# Patient Record
Sex: Male | Born: 1995 | Race: White | Hispanic: No | Marital: Single | Smoking: Never smoker
Health system: Southern US, Community
[De-identification: ages and names within clinical notes are randomized; demographics above are authoritative.]

---
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
## 2015-01-21 IMAGING — CR DG HAND COMPLETE 3+V*L*
1 series · 3 of 3 positions shown · non-contrast
Comparison: None.

CLINICAL DATA: Pain post trauma

EXAM:
LEFT HAND - COMPLETE 3+ VIEW

[Series 1: pa · 0.17mm/px · 3 of 3 slices shown]
[im 1/3]
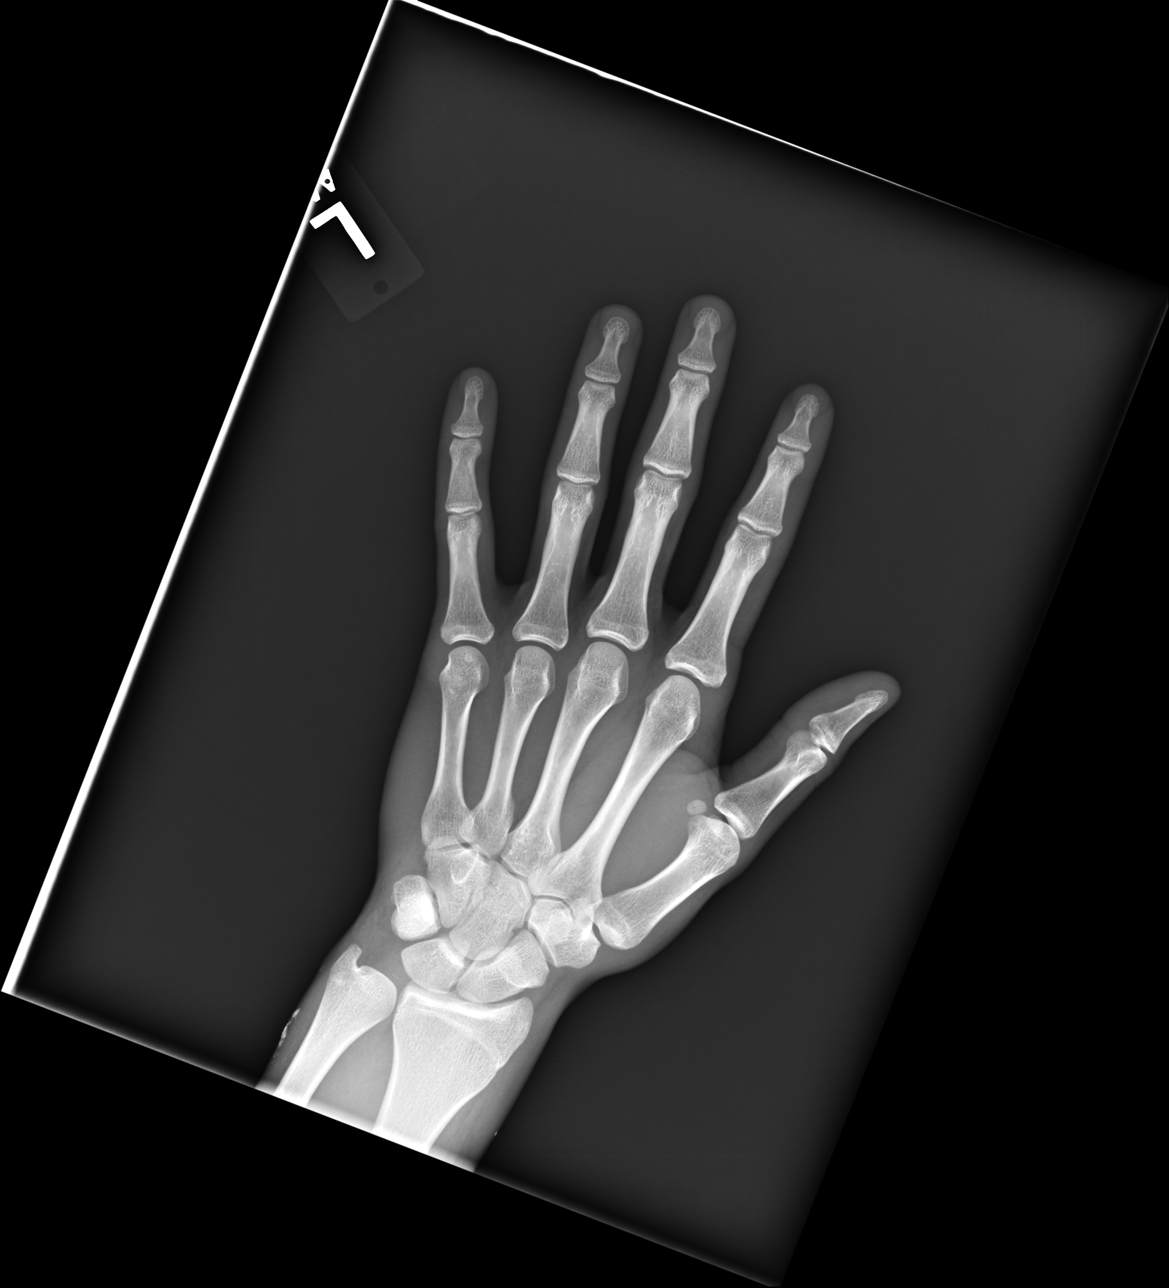
[im 2/3]
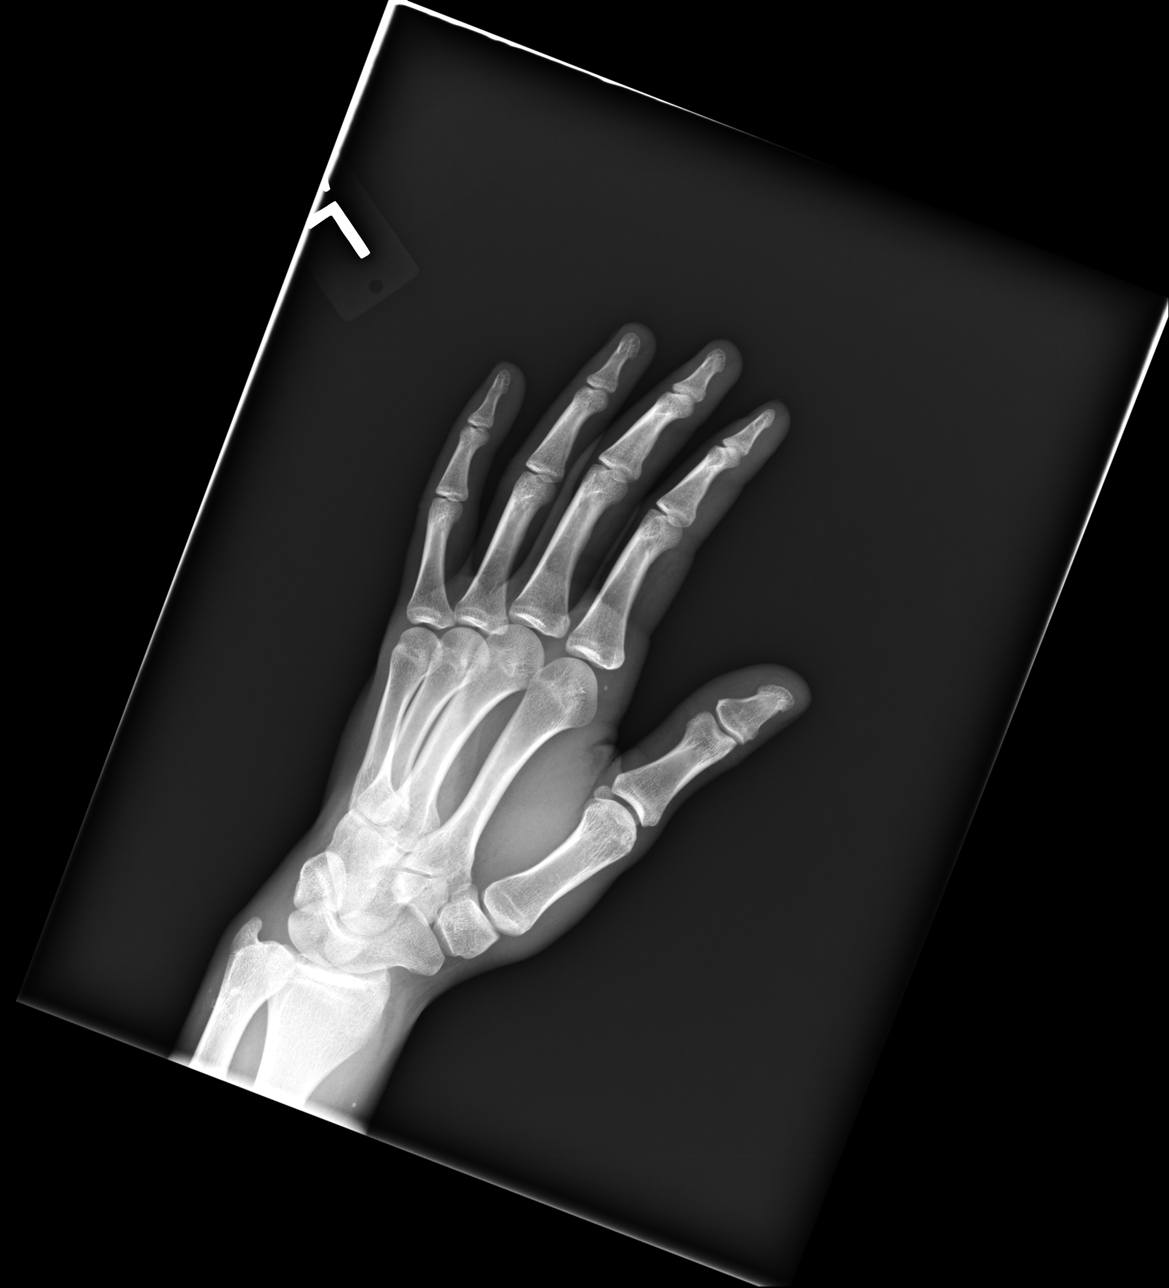
[im 3/3]
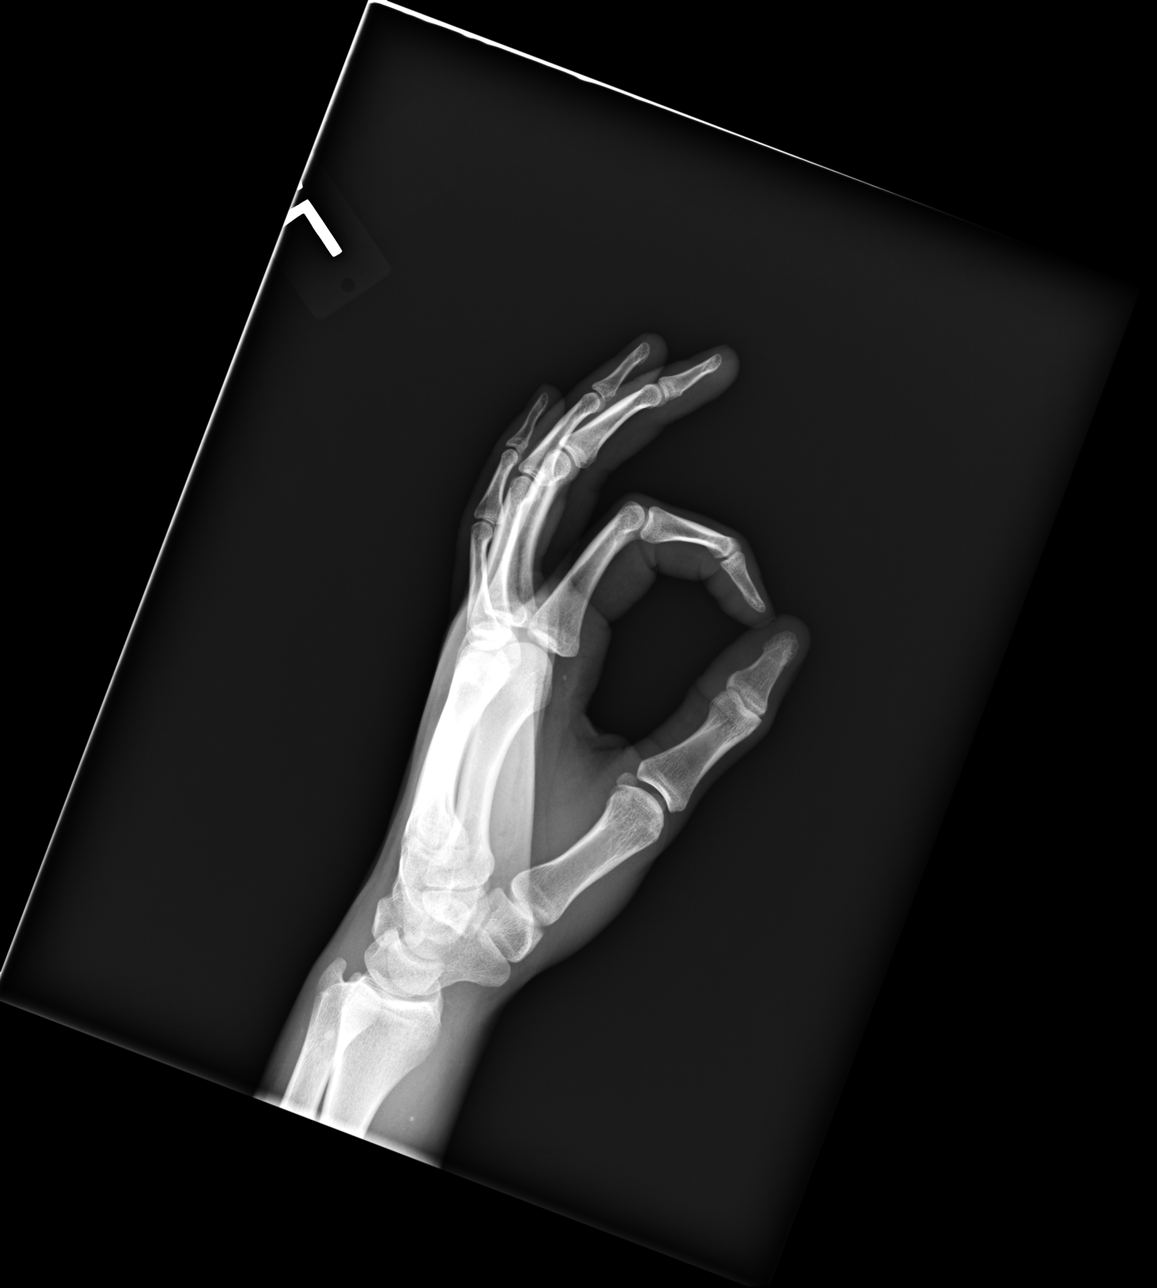

[3 of 3 positions shown; findings below may reference images not displayed]

FINDINGS: Frontal, oblique, and lateral views were obtained. There is no
fracture or dislocation. Joint spaces appear intact. No erosive
change.
IMPRESSION: No abnormality noted.

## 2018-09-04 ENCOUNTER — Encounter: Payer: Self-pay | Admitting: Emergency Medicine

## 2018-09-04 ENCOUNTER — Other Ambulatory Visit: Payer: Self-pay

## 2018-09-04 ENCOUNTER — Telehealth: Payer: Self-pay | Admitting: Family Medicine

## 2018-09-04 ENCOUNTER — Ambulatory Visit
Admission: EM | Admit: 2018-09-04 | Discharge: 2018-09-04 | Disposition: A | Discharge status: Home / Self Care | Payer: BLUE CROSS/BLUE SHIELD | Attending: Family Medicine | Admitting: Family Medicine

## 2018-09-04 DIAGNOSIS — R05 Cough: Secondary | ICD-10-CM | POA: Diagnosis not present

## 2018-09-04 DIAGNOSIS — R059 Cough, unspecified: Secondary | ICD-10-CM

## 2018-09-04 MED ORDER — BENZONATATE 100 MG PO CAPS: 100.0000 mg | ORAL_CAPSULE | Freq: Three times a day (TID) | ORAL | 0 refills | Status: DC | PRN

## 2018-09-04 NOTE — ED Provider Notes (Signed)
[facility]    CSN: [id number] Arrival date & time: 09/04/18  1255   History   Chief Complaint Chief Complaint  Patient presents with  . Cough   HPI  23 year old male presents with cough.  Patient reports cough and chest soreness that started this morning.  No fever.  Patient reports that his chest hurts associated with a cough.  5/10 in severity.  No fever.  No medications or interventions tried.  No reported sick contacts.  No recent travel.  No known exacerbating factors.  No other associated symptoms.  History reviewed as below. PMH: Warts  Home Medications    Prior to Admission medications   Not on File    Family History Family History  Problem Relation Age of Onset  . Healthy Mother   . Healthy Father     Social History Social History   Tobacco Use  . Smoking status: Never Smoker  . Smokeless tobacco: Never Used  Substance Use Topics  . Alcohol use: Yes  . Drug use: Never     Allergies   Patient has no known allergies.   Review of Systems Review of Systems  Constitutional: Negative for fever.  Respiratory: Positive for cough.    Physical Exam Triage Vital Signs ED Triage Vitals  Enc Vitals Group     BP 09/04/18 1312 116/65     Pulse Rate 09/04/18 1312 70     Resp 09/04/18 1312 16     Temp 09/04/18 1312 98.3 F (36.8 C)     Temp Source 09/04/18 1312 Oral     SpO2 09/04/18 1312 100 %     Weight 09/04/18 1310 149 lb (67.6 kg)     Height 09/04/18 1310 5\' 9"  (1.753 m)     Head Circumference --      Peak Flow --      Pain Score 09/04/18 1309 5     Pain Loc --      Pain Edu? --      Excl. in GC? --    Updated Vital Signs BP 116/65 (BP Location: Left Arm)   Pulse 70   Temp 98.3 F (36.8 C) (Oral)   Resp 16   Ht 5\' 9"  (1.753 m)   Wt 67.6 kg   SpO2 100%   BMI 22.00 kg/m   Visual Acuity Right Eye Distance:   Left Eye Distance:   Bilateral Distance:    Right Eye Near:   Left Eye Near:    Bilateral Near:      Physical Exam Vitals signs and nursing note reviewed.  Constitutional:      General: He is not in acute distress.    Appearance: Normal appearance.  HENT:     Head: Normocephalic and atraumatic.     Mouth/Throat:     Pharynx: Oropharynx is clear. No oropharyngeal exudate.  Eyes:     General:        Right eye: No discharge.        Left eye: No discharge.     Conjunctiva/sclera: Conjunctivae normal.  Cardiovascular:     Rate and Rhythm: Normal rate and regular rhythm.  Pulmonary:     Effort: Pulmonary effort is normal.     Breath sounds: Normal breath sounds. No wheezing, rhonchi or rales.  Neurological:     Mental Status: He is alert.  Psychiatric:        Mood and Affect: Mood normal.        Behavior: Behavior normal.  UC Treatments / Results  Labs (all labs ordered are listed, but only abnormal results are displayed) Labs Reviewed - No data to display  EKG None  Radiology No results found.  Procedures Procedures (including critical care time)  Medications Ordered in UC Medications - No data to display  Initial Impression / Assessment and Plan / UC Course  I have reviewed the triage vital signs and the nursing notes.  Pertinent labs & imaging results that were available during my care of the patient were reviewed by me and considered in my medical decision making (see chart for details).    23 year old male presents with cough.  Exam unremarkable.  No indications for further testing or work-up.  Tessalon Perles for cough.  Supportive care  Final Clinical Impressions(s) / UC Diagnoses   Final diagnoses:  Cough   Discharge Instructions   None    ED Prescriptions    Medication Sig Dispense Auth. Provider   benzonatate (TESSALON) 100 MG capsule Take 1 capsule (100 mg total) by mouth 3 (three) times daily as needed for cough. 30 capsule Tommie Sams, DO     Controlled Substance Prescriptions [place] Controlled Substance Registry consulted? Not Applicable    Tommie Sams, DO 09/04/18 1414

## 2018-09-04 NOTE — ED Triage Notes (Signed)
Patient c/o cough, congestion and chest discomfort that started this morning.  Patient denies fevers.
